# Patient Record
Sex: Female | Born: 2000 | Race: Black or African American | Hispanic: No | Marital: Single | Smoking: Never smoker
Health system: Southern US, Community
[De-identification: ages and names within clinical notes are randomized; demographics above are authoritative.]

## PROBLEM LIST (undated history)

## (undated) DIAGNOSIS — E559 Vitamin D deficiency, unspecified: Secondary | ICD-10-CM

## (undated) HISTORY — DX: Vitamin D deficiency, unspecified: E55.9

---
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
## 2019-06-29 ENCOUNTER — Other Ambulatory Visit: Payer: Self-pay

## 2019-06-29 DIAGNOSIS — Z20822 Contact with and (suspected) exposure to covid-19: Secondary | ICD-10-CM

## 2019-07-01 LAB — NOVEL CORONAVIRUS, NAA: SARS-CoV-2, NAA: NOT DETECTED

## 2019-07-02 ENCOUNTER — Telehealth: Payer: Self-pay | Admitting: *Deleted

## 2019-07-02 NOTE — Telephone Encounter (Signed)
Patient called ,given negative covid results . 

## 2023-05-19 ENCOUNTER — Encounter (INDEPENDENT_AMBULATORY_CARE_PROVIDER_SITE_OTHER): Payer: Self-pay | Admitting: Adult Health

## 2023-06-02 ENCOUNTER — Encounter: Payer: Self-pay | Admitting: Family Medicine

## 2023-06-02 ENCOUNTER — Ambulatory Visit (INDEPENDENT_AMBULATORY_CARE_PROVIDER_SITE_OTHER): Payer: Managed Care, Other (non HMO) | Admitting: Family Medicine

## 2023-06-02 VITALS — BP 114/76 | HR 66 | Temp 98.3°F | Ht 64.5 in | Wt 289.0 lb

## 2023-06-02 DIAGNOSIS — Z6841 Body Mass Index (BMI) 40.0 and over, adult: Secondary | ICD-10-CM

## 2023-06-02 DIAGNOSIS — E66813 Obesity, class 3: Secondary | ICD-10-CM

## 2023-06-02 DIAGNOSIS — Z0289 Encounter for other administrative examinations: Secondary | ICD-10-CM

## 2023-06-02 NOTE — Progress Notes (Signed)
Office: [phone]  /  Fax: [phone]   Initial Visit  Jessica Trevino was seen in clinic today to evaluate for obesity. She is interested in losing weight to improve overall health and reduce the risk of weight related complications. She presents today to review program treatment options, initial physical assessment, and evaluation.     She was referred by: PCP  When asked what else they would like to accomplish? She states: Adopt healthier eating patterns and would like to feel fitter  Weight history: started to gain weight in college, worse after living in an apartment.  She has been overweight since early childhood  When asked how has your weight affected you? She states: Contributed to orthopedic problems or mobility issuesfeet have hurt more  Some associated conditions: None  Contributing factors: Family history of obesity and Moderate to high levels of stress  Weight promoting medications identified: None  Current nutrition plan: None  Current level of physical activity: Strength training  Current or previous pharmacotherapy: GLP-1 taking compounded semaglutide thru her PCP x 4 weeks  Response to medication:  no problems yet; has lost 11 lb in 4 weeks with 1600 cal on [employer]itness[employer]   Past medical history includes:  History reviewed. No pertinent past medical history.   Objective:   BP 114/76   Pulse 66   Temp 98.3 F (36.8 C)   Ht 5' 4.5" (1.638 m)   Wt 289 lb (131.1 kg)   SpO2 98%   BMI 48.84 kg/m  She was weighed on the bioimpedance scale: Body mass index is 48.84 kg/m.  Peak Weight:300 , Body Fat%:142.6, Visceral Fat Rating:14, Weight trend over the last 12 months: Decreasing  General:  Alert, oriented and cooperative. Patient is in no acute distress.  Respiratory: Normal respiratory effort, no problems with respiration noted   Gait: able to ambulate independently  Mental Status: Normal mood and affect. Normal behavior. Normal judgment and thought  content.   DIAGNOSTIC DATA REVIEWED:  BMET No results found for: "NA", "K", "CL", "CO2", "GLUCOSE", "BUN", "CREATININE", "CALCIUM", "GFRNONAA", "GFRAA" No results found for: "HGBA1C" No results found for: "INSULIN" CBC No results found for: "WBC", "RBC", "HGB", "HCT", "PLT", "MCV", "MCH", "MCHC", "RDW" Iron/TIBC/Ferritin/ %Sat No results found for: "IRON", "TIBC", "FERRITIN", "IRONPCTSAT" Lipid Panel  No results found for: "CHOL", "TRIG", "HDL", "CHOLHDL", "VLDL", "LDLCALC", "LDLDIRECT" Hepatic Function Panel  No results found for: "PROT", "ALBUMIN", "AST", "ALT", "ALKPHOS", "BILITOT", "BILIDIR", "IBILI" No results found for: "TSH"   Assessment and Plan:   Class 3 severe obesity due to excess calories with body mass index (BMI) of 45.0 to 49.9 in adult, unspecified whether serious comorbidity present [facility]) Assessment & Plan: Reviewed program information, her body composition results and her weight history.  She is highly motivated to continue working on achieving a healthier weight.  She will return for EKG, fasting labs and indirect calorimetry         Obesity Treatment / Action Plan:  Patient will work on garnering support from family and friends to begin weight loss journey. Will work on eliminating or reducing the presence of highly palatable, calorie dense foods in the home. Will complete provided nutritional and psychosocial assessment questionnaire before the next appointment. Will be scheduled for indirect calorimetry to determine resting energy expenditure in a fasting state.  This will allow [place] to create a reduced calorie, high-protein meal plan to promote loss of fat mass while preserving muscle mass. Will think about ideas on how to incorporate physical activity into  their daily routine. Counseled on the health benefits of losing 5%-15% of total body weight. Was counseled on nutritional approaches to weight loss and benefits of reducing processed foods and  consuming plant-based foods and high quality protein as part of nutritional weight management. Was counseled on pharmacotherapy and role as an adjunct in weight management.   Obesity Education Performed Today:  She was weighed on the bioimpedance scale and results were discussed and documented in the synopsis.  We discussed obesity as a disease and the importance of a more detailed evaluation of all the factors contributing to the disease.  We discussed the importance of long term lifestyle changes which include nutrition, exercise and behavioral modifications as well as the importance of customizing this to her specific health and social needs.  We discussed the benefits of reaching a healthier weight to alleviate the symptoms of existing conditions and reduce the risks of the biomechanical, metabolic and psychological effects of obesity.  Jessica Trevino appears to be in the action stage of change and states they are ready to start intensive lifestyle modifications and behavioral modifications.  25 minutes was spent today on this visit including the above counseling, pre-visit chart review, and post-visit documentation.  Reviewed by clinician on day of visit: allergies, medications, problem list, medical history, surgical history, family history, social history, and previous encounter notes pertinent to obesity diagnosis.    Seymour Bars, D.O. DABFM, DABOM [facility] [phone] [address] [place], [place] [address] [phone]

## 2023-06-02 NOTE — Assessment & Plan Note (Signed)
Reviewed program information, her body composition results and her weight history.  She is highly motivated to continue working on achieving a healthier weight.  She will return for EKG, fasting labs and indirect calorimetry

## 2023-06-14 ENCOUNTER — Ambulatory Visit (INDEPENDENT_AMBULATORY_CARE_PROVIDER_SITE_OTHER): Payer: Managed Care, Other (non HMO) | Admitting: Family Medicine

## 2023-06-14 ENCOUNTER — Encounter: Payer: Self-pay | Admitting: Family Medicine

## 2023-06-14 VITALS — BP 99/61 | HR 66 | Temp 98.1°F | Ht 64.5 in | Wt 290.0 lb

## 2023-06-14 DIAGNOSIS — Z1331 Encounter for screening for depression: Secondary | ICD-10-CM | POA: Diagnosis not present

## 2023-06-14 DIAGNOSIS — E66813 Obesity, class 3: Secondary | ICD-10-CM

## 2023-06-14 DIAGNOSIS — D500 Iron deficiency anemia secondary to blood loss (chronic): Secondary | ICD-10-CM | POA: Insufficient documentation

## 2023-06-14 DIAGNOSIS — R632 Polyphagia: Secondary | ICD-10-CM | POA: Insufficient documentation

## 2023-06-14 DIAGNOSIS — Z6841 Body Mass Index (BMI) 40.0 and over, adult: Secondary | ICD-10-CM

## 2023-06-14 DIAGNOSIS — R0602 Shortness of breath: Secondary | ICD-10-CM | POA: Diagnosis not present

## 2023-06-14 DIAGNOSIS — R5383 Other fatigue: Secondary | ICD-10-CM | POA: Diagnosis not present

## 2023-06-14 NOTE — Progress Notes (Signed)
At a Glance:  Vitals Temp: 98.1 F (36.7 C) BP: 99/61 Pulse Rate: 66 SpO2: 100 %   Anthropometric Measurements Height: 5' 4.5" (1.638 m) Weight: 290 lb (131.5 kg) BMI (Calculated): 49.03 Starting Weight: 290lb Peak Weight: 300lb Waist Measurement : 49.5 inches   Body Composition  Body Fat %: 50.7 % Fat Mass (lbs): 147 lbs Muscle Mass (lbs): 135.8 lbs Total Body Water (lbs): 101 lbs Visceral Fat Rating : 15   Other Clinical Data RMR: 2117 Fasting: Yes Labs: Yes Today's Visit #: 1 Starting Date: 06/14/23   EKG: Normal sinus rhythm, rate 61.  Indirect Calorimeter completed today shows a VO2 of 306 and a REE of 2117.  Her calculated basal metabolic rate is 6283 thus her basal metabolic rate is worse than expected.  Chief Complaint:  Obesity   Subjective:  Jessica Trevino (MR# [id number]) is a 22 y.o. female who presents for evaluation and treatment of obesity and related comorbidities.   Jessica Trevino is currently in the action stage of change and ready to dedicate time achieving and maintaining a healthier weight. Jessica Trevino is interested in becoming our patient and working on intensive lifestyle modifications including (but not limited to) diet and exercise for weight loss.  Jessica Trevino has been struggling with her weight. She has been unsuccessful in either losing weight, maintaining weight loss, or reaching her healthy weight goal.  Jessica Trevino's habits were reviewed today and are as follows: Her family eats meals together, her desired weight loss is 90 lb, she has been heavy most of her life, she started gaining weight in college but has been overweight since early childhood. she skips meals frequently, usually breakfast. she frequently eats larger portions than normal, she has binge eating behaviors, and she struggles with emotional eating.  She is finishing up undergrad classes and plans to apply to PA school.   Other Fatigue Jessica Trevino admits to daytime somnolence and admits to  waking up still tired. Patient has a history of symptoms of morning fatigue. Jessica Trevino generally gets 6 or 7 hours of sleep per night, and states that she has generally restful sleep. Snoring is not present. Apneic episodes is not present. Epworth Sleepiness Score is 8.   Shortness of Breath Jessica Trevino notes increasing shortness of breath with exercising and seems to be worsening over time with weight gain. She notes getting out of breath sooner with activity than she used to. This has gotten worse recently. Jessica Trevino denies shortness of breath at rest or orthopnea.   Depression Screen Jessica Trevino's Food and Mood (modified PHQ-9) score was 6.      No data to display           Assessment and Plan:   Other Fatigue Jessica Trevino does not feel that her weight is causing her energy to be lower than it should be. Fatigue may be related to obesity, depression or many other causes. Labs will be ordered, and in the meanwhile, Jessica Trevino will focus on self care including making healthy food choices, increasing physical activity and focusing on stress reduction.  Shortness of Breath Jessica Trevino does feel that she gets out of breath more easily that she used to when she exercises. Jessica Trevino's shortness of breath appears to be obesity related and exercise induced. She has agreed to work on weight loss and gradually increase exercise to treat her exercise induced shortness of breath. Will continue to monitor closely.  Jessica Trevino had a positive depression screening. Depression is commonly associated with obesity and often results in emotional eating behaviors.  We will monitor this closely and work on CBT to help improve the non-hunger eating patterns. Referral to Psychology may be required if no improvement is seen as she continues in our clinic.    Problem List Items Addressed This Visit     Class 3 severe obesity due to excess calories with body mass index (BMI) of 45.0 to 49.9 in adult [name])   Polyphagia    Polyphagia has improved on  compounded semaglutide 2.5 mg weekly x 6 weeks with her PCP.  We have discussed that our program does not support compounded GLP-1 agents.    She has tolerated this fairly well without GI SE.  In addition, she is sporadically taking Phentermine 37.5 mg in the mornings for appetite control.  According to her bioimpedence results, she is losing muscle mass, probably due to inadequate intake of lean protein.    Plan: recommend stopping Phentermine while continuing semaglutide per PCP instructions.  Focus on starting cat 3 meal plan and prioritizing lean protein intake with meals and snacks to minimize muscle loss.        Iron deficiency anemia due to chronic blood loss    MCV and MCHC were low on most recent CBC reviewed from PCP office dated 05/04/23.   She has fairly heavy regular menses, not on birth control and has some complaints of fatigue.  Check iron levels today      Other Visit Diagnoses     SOBOE (shortness of breath on exertion)    -  Primary   Other fatigue       Relevant Orders   EKG 12-Lead   Comprehensive metabolic panel   Lipid panel   Vitamin B12   Ferritin   Iron and TIBC   Folate       Jessica Trevino is currently in the action stage of change and her goal is to continue with weight loss efforts. I recommend Jessica Trevino begin the structured treatment plan as follows:  She has agreed to Category 3 Plan  Exercise goals: All adults should avoid inactivity. Some activity is better than none, and adults who participate in any amount of physical activity, gain some health benefits.  Behavioral modification strategies:increasing lean protein intake, increasing vegetables, increase H2O intake, decreasing eating out, no skipping meals, meal planning and cooking strategies, keeping healthy foods in the home, better snacking choices, emotional eating strategies , holiday eating strategies, and planning for success  She was informed of the importance of frequent follow-up visits to  maximize her success with intensive lifestyle modifications for her multiple health conditions. She was informed we would discuss her lab results at her next visit unless there is a critical issue that needs to be addressed sooner. Ceilidh agreed to keep her next visit at the agreed upon time to discuss these results.  Objective:  General: Cooperative, alert, well developed, in no acute distress. HEENT: Conjunctivae and lids unremarkable. Cardiovascular: Regular rhythm.  Lungs: Normal work of breathing. Neurologic: No focal deficits.   No results found for: "CREATININE", "BUN", "NA", "K", "CL", "CO2" No results found for: "ALT", "AST", "GGT", "ALKPHOS", "BILITOT" No results found for: "HGBA1C" No results found for: "INSULIN" No results found for: "TSH" No results found for: "CHOL", "HDL", "LDLCALC", "LDLDIRECT", "TRIG", "CHOLHDL" No results found for: "WBC", "HGB", "HCT", "MCV", "PLT" No results found for: "IRON", "TIBC", "FERRITIN" Labs from 05/04/23 reviewed from Dr Renae Gloss includes: A1c 5.5 TSH 3.19 Vitamin D 19.7 Fasting Insulin 13.5 CBC WNL, free T4 WNL  Attestation Statements:  Reviewed by clinician on day of visit: allergies, medications, problem list, medical history, surgical history, family history, social history, and previous encounter notes.  Time spent on visit including pre-visit chart review and post-visit charting and care was 40 minutes.   Glennis Brink, DO

## 2023-06-14 NOTE — Assessment & Plan Note (Signed)
Polyphagia has improved on compounded semaglutide 2.5 mg weekly x 6 weeks with her PCP.  We have discussed that our program does not support compounded GLP-1 agents.    She has tolerated this fairly well without GI SE.  In addition, she is sporadically taking Phentermine 37.5 mg in the mornings for appetite control.  According to her bioimpedence results, she is losing muscle mass, probably due to inadequate intake of lean protein.    Plan: recommend stopping Phentermine while continuing semaglutide per PCP instructions.  Focus on starting cat 3 meal plan and prioritizing lean protein intake with meals and snacks to minimize muscle loss.

## 2023-06-14 NOTE — Assessment & Plan Note (Signed)
MCV and MCHC were low on most recent CBC reviewed from PCP office dated 05/04/23.   She has fairly heavy regular menses, not on birth control and has some complaints of fatigue.  Check iron levels today

## 2023-06-15 LAB — LIPID PANEL
Chol/HDL Ratio: 2.9 {ratio} (ref 0.0–4.4)
Cholesterol, Total: 135 mg/dL (ref 100–199)
HDL: 46 mg/dL (ref >39)
LDL Chol Calc (NIH): 71 mg/dL (ref 0–99)
Triglycerides: 94 mg/dL (ref 0–149)
VLDL Cholesterol Cal: 18 mg/dL (ref 5–40)

## 2023-06-15 LAB — COMPREHENSIVE METABOLIC PANEL
ALT: 14 [IU]/L (ref 0–32)
AST: 17 [IU]/L (ref 0–40)
Albumin: 4.3 g/dL (ref 4.0–5.0)
Alkaline Phosphatase: 64 [IU]/L (ref 44–121)
BUN/Creatinine Ratio: 9 (ref 9–23)
BUN: 8 mg/dL (ref 6–20)
Bilirubin Total: 0.2 mg/dL (ref 0.0–1.2)
CO2: 22 mmol/L (ref 20–29)
Calcium: 9.2 mg/dL (ref 8.7–10.2)
Chloride: 103 mmol/L (ref 96–106)
Creatinine, Ser: 0.93 mg/dL (ref 0.57–1.00)
Globulin, Total: 2.4 g/dL (ref 1.5–4.5)
Glucose: 80 mg/dL (ref 70–99)
Potassium: 4.2 mmol/L (ref 3.5–5.2)
Sodium: 139 mmol/L (ref 134–144)
Total Protein: 6.7 g/dL (ref 6.0–8.5)
eGFR: 89 mL/min/{1.73_m2} (ref >59)

## 2023-06-15 LAB — FERRITIN: Ferritin: 64 ng/mL (ref 15–150)

## 2023-06-15 LAB — IRON AND TIBC
Iron Saturation: 10 % — ABNORMAL LOW (ref 15–55)
Iron: 31 ug/dL (ref 27–159)
Total Iron Binding Capacity: 312 ug/dL (ref 250–450)
UIBC: 281 ug/dL (ref 131–425)

## 2023-06-15 LAB — FOLATE: Folate: 6.1 ng/mL (ref >3.0)

## 2023-06-15 LAB — VITAMIN B12: Vitamin B-12: 928 pg/mL (ref 232–1245)

## 2023-06-28 ENCOUNTER — Encounter: Payer: Self-pay | Admitting: Family Medicine

## 2023-06-28 ENCOUNTER — Ambulatory Visit: Payer: Managed Care, Other (non HMO) | Admitting: Family Medicine

## 2023-06-28 VITALS — BP 103/67 | HR 83 | Temp 98.1°F | Ht 64.5 in | Wt 289.0 lb

## 2023-06-28 DIAGNOSIS — T383X5A Adverse effect of insulin and oral hypoglycemic [antidiabetic] drugs, initial encounter: Secondary | ICD-10-CM | POA: Diagnosis not present

## 2023-06-28 DIAGNOSIS — D500 Iron deficiency anemia secondary to blood loss (chronic): Secondary | ICD-10-CM | POA: Diagnosis not present

## 2023-06-28 DIAGNOSIS — E559 Vitamin D deficiency, unspecified: Secondary | ICD-10-CM | POA: Diagnosis not present

## 2023-06-28 DIAGNOSIS — E66813 Obesity, class 3: Secondary | ICD-10-CM

## 2023-06-28 DIAGNOSIS — Z6841 Body Mass Index (BMI) 40.0 and over, adult: Secondary | ICD-10-CM

## 2023-06-28 DIAGNOSIS — K5903 Drug induced constipation: Secondary | ICD-10-CM | POA: Insufficient documentation

## 2023-06-28 NOTE — Assessment & Plan Note (Signed)
Reviewed lab results with patient.  Her iron saturation came back low at 10%.  She has previously had iron deficiency anemia due to heavy menses.  She has not required IV iron infusions.  She is currently not on birth control.  Recommend adding in a women's multivitamin that has iron in it once daily.

## 2023-06-28 NOTE — Patient Instructions (Addendum)
You can swap out Fairlife fat free milk for a cup of Silk High protein milk (unsweet) or a protein shake like Fairlife, CorePower (26) or Premier Protein -- can have as a snack/ with meals or added to coffee  Work on getting 100 oz of water daily Stay on Digestive enzymes Add in a sugar free electrolyte packet daily Add in a powdered mag citrate daily Stay on vitamin D daily  Add a women's multivitamin (with iron in it) I would recommend staying off of Phentermine

## 2023-06-28 NOTE — Assessment & Plan Note (Signed)
Worsened by semaglutide, compounded agent prescribed by Dr. Renae Gloss.  She seems to have moved up to a comparable 5 mg dose which did worsen her constipation.  She has been working on increasing her water intake, increasing fiber intake, taking psyllium husk's and MiraLAX as needed.  She also has an over-the-counter digestive enzyme which has helped some with bloating and constipation.  In addition, she has sporadically been taking phentermine 37.5 mg capsule in the morning, also prescribed by her PCP.  I did recommend following up with her PCP who prescribes her semaglutide about her side effect.  Recommend discontinuing phentermine.  Recommend increasing water intake to 100 ounces per day, adding in a sugar-free electrolyte drink mix once daily.  Continue digestive enzymes daily.  Add in mag citrate powder once daily.  Use MiraLAX as needed.  Try increasing intake of fruits and vegetables.

## 2023-06-28 NOTE — Assessment & Plan Note (Signed)
Vitamin D level was updated by her PCP on 05/04/2023, reviewed last visit at 19.7.  She is currently taking over-the-counter vitamin D3 plus K2 supplement.  She shows me a picture of her bottle and it is a 10,000 IU once daily supplement.  She may continue this once daily and repeat lab level in 2 to 3 months

## 2023-06-28 NOTE — Progress Notes (Signed)
Office: [phone]  /  Fax: [phone]  WEIGHT SUMMARY AND BIOMETRICS  Starting Date: 06/14/23  Starting Weight: 290lb   Weight Lost Since Last Visit: 1lb   Vitals Temp: 98.1 F (36.7 C) BP: 103/67 Pulse Rate: 83 SpO2: 100 %   Body Composition  Body Fat %: 49.9 % Fat Mass (lbs): 144.4 lbs Muscle Mass (lbs): 137.6 lbs Total Body Water (lbs): 99.6 lbs Visceral Fat Rating : 15    HPI  Chief Complaint: OBESITY  Jessica Trevino is here to discuss her progress with her obesity treatment plan. She is on the the Category 3 Plan and states she is following her eating plan approximately 10 % of the time. She states she is exercising 0 minutes 0 times per week.  Interval History:  Since last office visit she is down 1 lb She was taking semaglutide through her PCP, recently started She held due to constipation for a week She did go up on her dose (? Unsure of mg) for a week with improved satiety (prescribed by PCP) without GI upset or meal skipping She plans to work more on meal planning and meal prep She plans to increase walking and weight training with her sister over her holiday break from school  Pharmacotherapy: semaglutide once weekly compounded thru [facility] (prescribed by Dr Renae Gloss)  PHYSICAL EXAM:  Blood pressure 103/67, pulse 83, temperature 98.1 F (36.7 C), height 5' 4.5" (1.638 m), weight 289 lb (131.1 kg), SpO2 100%. Body mass index is 48.84 kg/m.  General: She is overweight, cooperative, alert, well developed, and in no acute distress. PSYCH: Has normal mood, affect and thought process.   Lungs: Normal breathing effort, no conversational dyspnea.   ASSESSMENT AND PLAN  TREATMENT PLAN FOR OBESITY:  Recommended Dietary Goals  Jessica Trevino is currently in the action stage of change. As such, her goal is to continue weight management plan. She has agreed to the Category 3 Plan. - reviewed alternatives to FairLife Milk - keep kcals 1500-1600 daily  including 120 g of protein daily  Behavioral Intervention  We discussed the following Behavioral Modification Strategies today: increasing lean protein intake to established goals, increasing water intake , keeping healthy foods at home, work on managing stress, creating time for self-care and relaxation, avoiding temptations and identifying enticing environmental cues, continue to work on implementation of reduced calorie nutritional plan, planning for success, and continue to work on maintaining a reduced calorie state, getting the recommended amount of protein, incorporating whole foods, making healthy choices, staying well hydrated and practicing mindfulness when eating..  Additional resources provided today: NA  Recommended Physical Activity Goals  Jessica Trevino has been advised to work up to 150 minutes of moderate intensity aerobic activity a week and strengthening exercises 2-3 times per week for cardiovascular health, weight loss maintenance and preservation of muscle mass.   She has agreed to [employer] strengthening exercises with a goal of 2-3 sessions a week  and Start aerobic activity with a goal of 150 minutes a week at moderate intensity.   Pharmacotherapy changes for the treatment of obesity: none  ASSOCIATED CONDITIONS ADDRESSED TODAY  Drug induced constipation Assessment & Plan: Worsened by semaglutide, compounded agent prescribed by Dr. Renae Gloss.  She seems to have moved up to a comparable 5 mg dose which did worsen her constipation.  She has been working on increasing her water intake, increasing fiber intake, taking psyllium husk's and MiraLAX as needed.  She also has an over-the-counter digestive enzyme which has helped some with  bloating and constipation.  In addition, she has sporadically been taking phentermine 37.5 mg capsule in the morning, also prescribed by her PCP.  I did recommend following up with her PCP who prescribes her semaglutide about her side effect.  Recommend  discontinuing phentermine.  Recommend increasing water intake to 100 ounces per day, adding in a sugar-free electrolyte drink mix once daily.  Continue digestive enzymes daily.  Add in mag citrate powder once daily.  Use MiraLAX as needed.  Try increasing intake of fruits and vegetables.   Class 3 severe obesity due to excess calories with body mass index (BMI) of 45.0 to 49.9 in adult, unspecified whether serious comorbidity present (HCC)  Iron deficiency anemia due to chronic blood loss Assessment & Plan: Reviewed lab results with patient.  Her iron saturation came back low at 10%.  She has previously had iron deficiency anemia due to heavy menses.  She has not required IV iron infusions.  She is currently not on birth control.  Recommend adding in a women's multivitamin that has iron in it once daily.   Vitamin D deficiency Assessment & Plan: Vitamin D level was updated by her PCP on 05/04/2023, reviewed last visit at 19.7.  She is currently taking over-the-counter vitamin D3 plus K2 supplement.  She shows me a picture of her bottle and it is a 10,000 IU once daily supplement.  She may continue this once daily and repeat lab level in 2 to 3 months       She was informed of the importance of frequent follow up visits to maximize her success with intensive lifestyle modifications for her multiple health conditions.   ATTESTASTION STATEMENTS:  Reviewed by clinician on day of visit: allergies, medications, problem list, medical history, surgical history, family history, social history, and previous encounter notes pertinent to obesity diagnosis.   I have personally spent 30 minutes total time today in preparation, patient care, nutritional counseling and documentation for this visit, including the following: review of clinical lab tests; review of medical tests/procedures/services.      Glennis Brink, DO DABFM, DABOM [facility] [address] [place],  [place] [address] [phone]

## 2023-07-25 ENCOUNTER — Telehealth: Payer: Self-pay | Admitting: Family Medicine

## 2023-07-25 NOTE — Telephone Encounter (Signed)
 1/6 Pt called to cx appt due to insurance stating she needs to see a [place] provider. Pt stated she will be stopping the program and will rejoin if her insurance makes any changes. AIR

## 2023-07-26 ENCOUNTER — Ambulatory Visit: Payer: Managed Care, Other (non HMO) | Admitting: Family Medicine
